# Patient Record
Sex: Female | Born: 1985 | Race: Asian | Hispanic: No | Marital: Married | Smoking: Never smoker
Health system: Southern US, Community
[De-identification: ages and names within clinical notes are randomized; demographics above are authoritative.]

## PROBLEM LIST (undated history)

## (undated) DIAGNOSIS — Z8489 Family history of other specified conditions: Secondary | ICD-10-CM

## (undated) DIAGNOSIS — E039 Hypothyroidism, unspecified: Secondary | ICD-10-CM

## (undated) DIAGNOSIS — D649 Anemia, unspecified: Secondary | ICD-10-CM

## (undated) DIAGNOSIS — T8859XA Other complications of anesthesia, initial encounter: Secondary | ICD-10-CM

---

## 2020-04-22 ENCOUNTER — Ambulatory Visit: Payer: Self-pay | Attending: Internal Medicine

## 2020-04-22 DIAGNOSIS — Z23 Encounter for immunization: Secondary | ICD-10-CM

## 2020-04-22 NOTE — Progress Notes (Signed)
   Covid-19 Vaccination Clinic  Name:  Margaret Roman    MRN: [id number] DOB: 02-Apr-1986  04/22/2020  Ms. Margaret Roman was observed post Covid-19 immunization for 15 minutes without incident. She was provided with Vaccine Information Sheet and instruction to access the V-Safe system.   Ms. Margaret Roman was instructed to call 911 with any severe reactions post vaccine: Marland Kitchen Difficulty breathing  . Swelling of face and throat  . A fast heartbeat  . A bad rash all over body  . Dizziness and weakness   Immunizations Administered    Name Date Dose VIS Date Route   Pfizer COVID-19 Vaccine 04/22/2020  4:06 PM 0.3 mL 02/11/2019 Intramuscular   Manufacturer: [employer], [employer]   Lot: [id number]   NDC: [address]-1

## 2020-05-18 ENCOUNTER — Ambulatory Visit: Payer: Self-pay | Attending: Internal Medicine

## 2020-05-18 DIAGNOSIS — Z23 Encounter for immunization: Secondary | ICD-10-CM

## 2020-05-18 NOTE — Progress Notes (Signed)
   Covid-19 Vaccination Clinic  Name:  Margaret Roman    MRN: [id number] DOB: [birth year]  05/18/2020  Ms. Margaret Roman was observed post Covid-19 immunization for 15 minutes without incident. She was provided with Vaccine Information Sheet and instruction to access the V-Safe system.   Ms. Margaret Roman was instructed to call 911 with any severe reactions post vaccine: Marland Kitchen Difficulty breathing  . Swelling of face and throat  . A fast heartbeat  . A bad rash all over body  . Dizziness and weakness   Immunizations Administered    Name Date Dose VIS Date Route   Pfizer COVID-19 Vaccine 05/18/2020  4:20 PM 0.3 mL 02/11/2019 Intramuscular   Manufacturer: [employer], [employer]   Lot: [id number]   NDC: [address]-3

## 2021-03-10 LAB — OB RESULTS CONSOLE ANTIBODY SCREEN: Antibody Screen: NEGATIVE

## 2021-03-10 LAB — OB RESULTS CONSOLE ABO/RH: RH Type: POSITIVE

## 2021-03-10 LAB — OB RESULTS CONSOLE RPR: RPR: NONREACTIVE

## 2021-03-10 LAB — OB RESULTS CONSOLE GC/CHLAMYDIA
Chlamydia: NEGATIVE
Gonorrhea: NEGATIVE

## 2021-03-10 LAB — OB RESULTS CONSOLE HIV ANTIBODY (ROUTINE TESTING): HIV: NONREACTIVE

## 2021-03-10 LAB — OB RESULTS CONSOLE RUBELLA ANTIBODY, IGM: Rubella: IMMUNE

## 2021-03-10 LAB — OB RESULTS CONSOLE HEPATITIS B SURFACE ANTIGEN: Hepatitis B Surface Ag: NEGATIVE

## 2021-05-10 DIAGNOSIS — O09522 Supervision of elderly multigravida, second trimester: Secondary | ICD-10-CM | POA: Diagnosis not present

## 2021-05-10 DIAGNOSIS — Z3A19 19 weeks gestation of pregnancy: Secondary | ICD-10-CM | POA: Diagnosis not present

## 2021-05-31 DIAGNOSIS — Z362 Encounter for other antenatal screening follow-up: Secondary | ICD-10-CM | POA: Diagnosis not present

## 2021-06-23 DIAGNOSIS — Z3A25 25 weeks gestation of pregnancy: Secondary | ICD-10-CM | POA: Diagnosis not present

## 2021-06-23 DIAGNOSIS — O99282 Endocrine, nutritional and metabolic diseases complicating pregnancy, second trimester: Secondary | ICD-10-CM | POA: Diagnosis not present

## 2021-07-15 DIAGNOSIS — Z3A28 28 weeks gestation of pregnancy: Secondary | ICD-10-CM | POA: Diagnosis not present

## 2021-07-15 DIAGNOSIS — O99283 Endocrine, nutritional and metabolic diseases complicating pregnancy, third trimester: Secondary | ICD-10-CM | POA: Diagnosis not present

## 2021-07-15 DIAGNOSIS — Z3689 Encounter for other specified antenatal screening: Secondary | ICD-10-CM | POA: Diagnosis not present

## 2021-07-15 DIAGNOSIS — Z23 Encounter for immunization: Secondary | ICD-10-CM | POA: Diagnosis not present

## 2021-08-11 DIAGNOSIS — Z3A32 32 weeks gestation of pregnancy: Secondary | ICD-10-CM | POA: Diagnosis not present

## 2021-08-11 DIAGNOSIS — O99283 Endocrine, nutritional and metabolic diseases complicating pregnancy, third trimester: Secondary | ICD-10-CM | POA: Diagnosis not present

## 2021-08-18 ENCOUNTER — Inpatient Hospital Stay (HOSPITAL_COMMUNITY)
Admission: AD | Admit: 2021-08-18 | Discharge: 2021-08-18 | Disposition: A | Discharge status: Home / Self Care | Payer: BC Managed Care – PPO | Attending: Obstetrics | Admitting: Obstetrics

## 2021-08-18 ENCOUNTER — Encounter (HOSPITAL_COMMUNITY): Payer: Self-pay

## 2021-08-18 ENCOUNTER — Other Ambulatory Visit: Payer: Self-pay

## 2021-08-18 DIAGNOSIS — Z79899 Other long term (current) drug therapy: Secondary | ICD-10-CM | POA: Diagnosis not present

## 2021-08-18 DIAGNOSIS — Z3A33 33 weeks gestation of pregnancy: Secondary | ICD-10-CM | POA: Diagnosis not present

## 2021-08-18 DIAGNOSIS — Y929 Unspecified place or not applicable: Secondary | ICD-10-CM | POA: Diagnosis not present

## 2021-08-18 DIAGNOSIS — Z3689 Encounter for other specified antenatal screening: Secondary | ICD-10-CM

## 2021-08-18 DIAGNOSIS — O09523 Supervision of elderly multigravida, third trimester: Secondary | ICD-10-CM | POA: Insufficient documentation

## 2021-08-18 DIAGNOSIS — Y939 Activity, unspecified: Secondary | ICD-10-CM | POA: Insufficient documentation

## 2021-08-18 DIAGNOSIS — O99891 Other specified diseases and conditions complicating pregnancy: Secondary | ICD-10-CM | POA: Diagnosis not present

## 2021-08-18 DIAGNOSIS — W19XXXA Unspecified fall, initial encounter: Secondary | ICD-10-CM

## 2021-08-18 DIAGNOSIS — W010XXA Fall on same level from slipping, tripping and stumbling without subsequent striking against object, initial encounter: Secondary | ICD-10-CM | POA: Insufficient documentation

## 2021-08-18 DIAGNOSIS — W101XXA Fall (on)(from) sidewalk curb, initial encounter: Secondary | ICD-10-CM

## 2021-08-18 HISTORY — DX: Hypothyroidism, unspecified: E03.9

## 2021-08-18 LAB — URINALYSIS, ROUTINE W REFLEX MICROSCOPIC
Bilirubin Urine: NEGATIVE
Glucose, UA: NEGATIVE mg/dL
Hgb urine dipstick: NEGATIVE
Ketones, ur: NEGATIVE mg/dL
Nitrite: NEGATIVE
Protein, ur: NEGATIVE mg/dL
Specific Gravity, Urine: 1.003 — ABNORMAL LOW (ref 1.005–1.030)
pH: 7 (ref 5.0–8.0)

## 2021-08-18 NOTE — MAU Note (Signed)
...  Margaret Roman is a 35 y.o. at [redacted]w[redacted]d here in MAU reporting: Patient states she was walking to the bust [place] her daughter up and fell at 1500 today. Patient states she did not hit her stomach directly but did land on her right side more towards her back "just a little bit." She states her right arm/wrist caught most of her fall/weight. Endorses rigorous fetal movement and states baby is always moving. No VB or LOF. Denies any pain and states "everything is fine and like normal like before."   Lab orders placed from triage: UA

## 2021-08-18 NOTE — MAU Provider Note (Signed)
History     CSN: [id number]  Arrival date and time: 08/18/21 1710   Event Date/Time   First Provider Initiated Contact with Patient 08/18/21 1807      Chief Complaint  Patient presents with   Fall   35 y.o. G2P1001 @33 .3 wks presenting after a fall. Reports tripping on a curb while at her child bus stop and fell to her right side. She landed on her leg, arm, and right side. She did not hit her abdomen. Reports good FM. No VB, LOF, ctx or pain. Denies head trauma or syncope.    OB History     Gravida  2   Para  1   Term  1   Preterm      AB      Living  1      SAB      IAB      Ectopic      Multiple      Live Births  1           Past Medical History:  Diagnosis Date   Hypothyroidism     Past Surgical History:  Procedure Laterality Date   CESAREAN SECTION      History reviewed. No pertinent family history.  Social History   Tobacco Use   Smoking status: Never   Smokeless tobacco: Never  Vaping Use   Vaping Use: Never used  Substance Use Topics   Alcohol use: Not Currently   Drug use: Never    Allergies:  Allergies  Allergen Reactions   Bee Pollen    Dust Mite Extract     Medications Prior to Admission  Medication Sig Dispense Refill Last Dose   ferrous sulfate 324 MG TBEC Take 324 mg by mouth.   08/17/2021   levothyroxine (SYNTHROID) 25 MCG tablet Take 25 mcg by mouth daily before breakfast.   08/18/2021   Prenatal Vit-Fe Fumarate-FA (PRENATAL MULTIVITAMIN) TABS tablet Take 1 tablet by mouth daily at 12 noon.   08/17/2021    Review of Systems  Gastrointestinal:  Negative for abdominal pain.  Genitourinary:  Negative for vaginal bleeding and vaginal discharge.  Physical Exam   Blood pressure 122/74, pulse (!) 103, temperature 98.6 F (37 C), temperature source Oral, resp. rate 18, height 5\' 1"  (1.549 m), weight 73 kg, SpO2 98 %.  Physical Exam Vitals and nursing note reviewed.  Constitutional:      General: She is not in acute  distress.    Appearance: Normal appearance.  HENT:     Head: Normocephalic and atraumatic.  Cardiovascular:     Rate and Rhythm: Normal rate.  Pulmonary:     Effort: Pulmonary effort is normal. No respiratory distress.  Abdominal:     Palpations: Abdomen is soft.     Tenderness: There is no abdominal tenderness.     Comments: gravid  Musculoskeletal:        General: Normal range of motion.     Cervical back: Normal range of motion.  Skin:    General: Skin is warm and dry.     Comments: Small skin scrape to left outer thigh  Neurological:     General: No focal deficit present.     Mental Status: She is alert and oriented to person, place, and time.  Psychiatric:        Mood and Affect: Mood normal.        Behavior: Behavior normal.  EFM: 135 bpm, mod variability, + accels, no decels Toco:  minimal UI  Results for orders placed or performed during the hospital encounter of 08/18/21 (from the past 24 hour(s))  Urinalysis, Routine w reflex microscopic Urine, Clean Catch     Status: Abnormal   Collection Time: 08/18/21  5:54 PM  Result Value Ref Range   Color, Urine STRAW (A) YELLOW   APPearance HAZY (A) CLEAR   Specific Gravity, Urine 1.003 (L) 1.005 - 1.030   pH 7.0 5.0 - 8.0   Glucose, UA NEGATIVE NEGATIVE mg/dL   Hgb urine dipstick NEGATIVE NEGATIVE   Bilirubin Urine NEGATIVE NEGATIVE   Ketones, ur NEGATIVE NEGATIVE mg/dL   Protein, ur NEGATIVE NEGATIVE mg/dL   Nitrite NEGATIVE NEGATIVE   Leukocytes,Ua MODERATE (A) NEGATIVE   RBC / HPF 0-5 0 - 5 RBC/hpf   WBC, UA 6-10 0 - 5 WBC/hpf   Bacteria, UA MANY (A) NONE SEEN   Squamous Epithelial / LPF 0-5 0 - 5   Mucus PRESENT    MAU Course  Procedures Prolonged EFM  MDM Feels well. No pain. Good FM. NST reactive. No signs of abruption. Denies VB, LOF, ctx. Stable for discharge home.   Assessment and Plan   1. [redacted] weeks gestation of pregnancy   2. NST (non-stress test) reactive   3. Fall, initial encounter     Discharge home Follow up at [facility] next week [facility] Abruption precautions Tylenol prn  Allergies as of 08/18/2021       Reactions   Bee Pollen    Dust Mite Extract         Medication List     TAKE these medications    ferrous sulfate 324 MG Tbec Take 324 mg by mouth.   levothyroxine 25 MCG tablet Commonly known as: SYNTHROID Take 25 mcg by mouth daily before breakfast.   prenatal multivitamin Tabs tablet Take 1 tablet by mouth daily at 12 noon.       Donette Larry, CNM 08/18/2021, 9:54 PM

## 2021-08-21 LAB — CULTURE, OB URINE: Culture: 50000 — AB

## 2021-08-25 ENCOUNTER — Telehealth: Payer: Self-pay | Admitting: Certified Nurse Midwife

## 2021-08-25 ENCOUNTER — Telehealth: Payer: Self-pay | Admitting: Student

## 2021-08-25 ENCOUNTER — Encounter: Payer: Self-pay | Admitting: Student

## 2021-08-25 DIAGNOSIS — Z2239 Carrier of other specified bacterial diseases: Secondary | ICD-10-CM | POA: Insufficient documentation

## 2021-08-25 DIAGNOSIS — Z3A34 34 weeks gestation of pregnancy: Secondary | ICD-10-CM | POA: Diagnosis not present

## 2021-08-25 DIAGNOSIS — O99283 Endocrine, nutritional and metabolic diseases complicating pregnancy, third trimester: Secondary | ICD-10-CM | POA: Diagnosis not present

## 2021-08-25 NOTE — Telephone Encounter (Signed)
Opened in error

## 2021-08-25 NOTE — Telephone Encounter (Signed)
TC with Dr. Ernestina Penna tp discuss Urine culture results and pharmacy recommendation for inpatient IV abx. Dr. Ernestina Penna will have patient come to [facility] for straight cath and urine culture at [facility].   Luna Kitchens

## 2021-09-02 ENCOUNTER — Other Ambulatory Visit: Payer: Self-pay | Admitting: Obstetrics & Gynecology

## 2021-09-09 DIAGNOSIS — Z3685 Encounter for antenatal screening for Streptococcus B: Secondary | ICD-10-CM | POA: Diagnosis not present

## 2021-09-09 LAB — OB RESULTS CONSOLE GBS: GBS: NEGATIVE

## 2021-09-13 ENCOUNTER — Encounter (HOSPITAL_COMMUNITY): Payer: Self-pay

## 2021-09-13 NOTE — Patient Instructions (Signed)
Margaret Roman  09/13/2021   Your procedure is scheduled on:  09/28/2021  Arrive at 1230 at Entrance C on [address] at [facility]  and [address]. You are invited to use the FREE valet parking or use the Visitor's parking deck.  Pick up the phone at the desk and dial [phone].  Call this number if you have problems the morning of surgery: [phone]  Remember:   Do not eat food:(After Midnight) Desps de medianoche.  Do not drink clear liquids: (After Midnight) Desps de medianoche.  Take these medicines the morning of surgery with A SIP OF WATER:  Take levothyroxine as prescribed   Do not wear jewelry, make-up or nail polish.  Do not wear lotions, powders, or perfumes. Do not wear deodorant.  Do not shave 48 hours prior to surgery.  Do not bring valuables to the hospital.  [facility] is not   responsible for any belongings or valuables brought to the hospital.  Contacts, dentures or bridgework may not be worn into surgery.  Leave suitcase in the car. After surgery it may be brought to your room.  For patients admitted to the hospital, checkout time is 11:00 AM the day of              discharge.      Please read over the following fact sheets that you were given:     Preparing for Surgery

## 2021-09-19 DIAGNOSIS — Z3A38 38 weeks gestation of pregnancy: Secondary | ICD-10-CM | POA: Diagnosis not present

## 2021-09-19 DIAGNOSIS — O26843 Uterine size-date discrepancy, third trimester: Secondary | ICD-10-CM | POA: Diagnosis not present

## 2021-09-26 ENCOUNTER — Other Ambulatory Visit: Payer: Self-pay | Admitting: Obstetrics & Gynecology

## 2021-09-26 ENCOUNTER — Other Ambulatory Visit: Payer: Self-pay

## 2021-09-26 ENCOUNTER — Encounter (HOSPITAL_COMMUNITY)
Admission: RE | Admit: 2021-09-26 | Discharge: 2021-09-26 | Disposition: A | Discharge status: Home / Self Care | Payer: BC Managed Care – PPO | Source: Ambulatory Visit | Attending: Obstetrics & Gynecology | Admitting: Obstetrics & Gynecology

## 2021-09-26 DIAGNOSIS — Z01812 Encounter for preprocedural laboratory examination: Secondary | ICD-10-CM | POA: Insufficient documentation

## 2021-09-26 DIAGNOSIS — O34219 Maternal care for unspecified type scar from previous cesarean delivery: Secondary | ICD-10-CM | POA: Diagnosis not present

## 2021-09-26 DIAGNOSIS — Z302 Encounter for sterilization: Secondary | ICD-10-CM | POA: Diagnosis not present

## 2021-09-26 DIAGNOSIS — D62 Acute posthemorrhagic anemia: Secondary | ICD-10-CM | POA: Diagnosis not present

## 2021-09-26 DIAGNOSIS — O34211 Maternal care for low transverse scar from previous cesarean delivery: Secondary | ICD-10-CM | POA: Diagnosis not present

## 2021-09-26 DIAGNOSIS — O9081 Anemia of the puerperium: Secondary | ICD-10-CM | POA: Diagnosis not present

## 2021-09-26 DIAGNOSIS — Z3A00 Weeks of gestation of pregnancy not specified: Secondary | ICD-10-CM | POA: Diagnosis not present

## 2021-09-26 DIAGNOSIS — Z3A39 39 weeks gestation of pregnancy: Secondary | ICD-10-CM | POA: Diagnosis not present

## 2021-09-26 DIAGNOSIS — E039 Hypothyroidism, unspecified: Secondary | ICD-10-CM | POA: Diagnosis not present

## 2021-09-26 DIAGNOSIS — Z23 Encounter for immunization: Secondary | ICD-10-CM | POA: Diagnosis not present

## 2021-09-26 DIAGNOSIS — O99284 Endocrine, nutritional and metabolic diseases complicating childbirth: Secondary | ICD-10-CM | POA: Diagnosis not present

## 2021-09-26 HISTORY — DX: Other complications of anesthesia, initial encounter: T88.59XA

## 2021-09-26 HISTORY — DX: Family history of other specified conditions: Z84.89

## 2021-09-26 HISTORY — DX: Anemia, unspecified: D64.9

## 2021-09-26 LAB — CBC
HCT: 34.5 % — ABNORMAL LOW (ref 36.0–46.0)
Hemoglobin: 11.8 g/dL — ABNORMAL LOW (ref 12.0–15.0)
MCH: 32.2 pg (ref 26.0–34.0)
MCHC: 34.2 g/dL (ref 30.0–36.0)
MCV: 94.3 fL (ref 80.0–100.0)
Platelets: 243 10*3/uL (ref 150–400)
RBC: 3.66 MIL/uL — ABNORMAL LOW (ref 3.87–5.11)
RDW: 13.6 % (ref 11.5–15.5)
WBC: 10.7 10*3/uL — ABNORMAL HIGH (ref 4.0–10.5)
nRBC: 0 % (ref 0.0–0.2)

## 2021-09-26 LAB — TYPE AND SCREEN
ABO/RH(D): O POS
Antibody Screen: NEGATIVE

## 2021-09-26 NOTE — Pre-Procedure Instructions (Signed)
PAT testing & information completed with pt/spouse.  They both verbalized understanding.  Both verbalized concern related to Succinylcholine allergy.  Spouse states that they have been told in their community/family that there was a sever reaction & that medication should be avoided. 

## 2021-09-26 NOTE — Pre-Procedure Instructions (Signed)
PAT testing & information completed with pt/spouse.  They both verbalized understanding.  Both verbalized concern related to Succinylcholine allergy.  Spouse states that they have been told in their community/family that there was a sever reaction & that medication should be avoided.

## 2021-09-27 LAB — RPR: RPR Ser Ql: NONREACTIVE

## 2021-09-27 LAB — SARS CORONAVIRUS 2 (TAT 6-24 HRS): SARS Coronavirus 2: NEGATIVE

## 2021-09-28 ENCOUNTER — Inpatient Hospital Stay (HOSPITAL_COMMUNITY): Payer: BC Managed Care – PPO

## 2021-09-28 ENCOUNTER — Inpatient Hospital Stay (HOSPITAL_COMMUNITY)
Admission: RE | Admit: 2021-09-28 | Discharge: 2021-09-30 | DRG: 784 | Disposition: A | Discharge status: Home / Self Care | Payer: BC Managed Care – PPO | Attending: Obstetrics & Gynecology | Admitting: Obstetrics & Gynecology

## 2021-09-28 ENCOUNTER — Encounter (HOSPITAL_COMMUNITY)
Admission: RE | Disposition: A | Discharge status: Home / Self Care | Payer: Self-pay | Source: Home / Self Care | Attending: Obstetrics & Gynecology

## 2021-09-28 ENCOUNTER — Encounter (HOSPITAL_COMMUNITY): Payer: Self-pay | Admitting: Obstetrics & Gynecology

## 2021-09-28 ENCOUNTER — Other Ambulatory Visit: Payer: Self-pay

## 2021-09-28 DIAGNOSIS — Z302 Encounter for sterilization: Secondary | ICD-10-CM

## 2021-09-28 DIAGNOSIS — O9081 Anemia of the puerperium: Secondary | ICD-10-CM | POA: Diagnosis not present

## 2021-09-28 DIAGNOSIS — E039 Hypothyroidism, unspecified: Secondary | ICD-10-CM | POA: Diagnosis present

## 2021-09-28 DIAGNOSIS — O34211 Maternal care for low transverse scar from previous cesarean delivery: Principal | ICD-10-CM | POA: Diagnosis present

## 2021-09-28 DIAGNOSIS — Z9079 Acquired absence of other genital organ(s): Secondary | ICD-10-CM

## 2021-09-28 DIAGNOSIS — Z23 Encounter for immunization: Secondary | ICD-10-CM | POA: Diagnosis not present

## 2021-09-28 DIAGNOSIS — O9902 Anemia complicating childbirth: Secondary | ICD-10-CM | POA: Diagnosis present

## 2021-09-28 DIAGNOSIS — O99284 Endocrine, nutritional and metabolic diseases complicating childbirth: Secondary | ICD-10-CM | POA: Diagnosis present

## 2021-09-28 DIAGNOSIS — D62 Acute posthemorrhagic anemia: Secondary | ICD-10-CM | POA: Diagnosis not present

## 2021-09-28 DIAGNOSIS — Z3A39 39 weeks gestation of pregnancy: Secondary | ICD-10-CM | POA: Diagnosis not present

## 2021-09-28 DIAGNOSIS — Z98891 History of uterine scar from previous surgery: Secondary | ICD-10-CM

## 2021-09-28 SURGERY — Surgical Case
Anesthesia: Spinal | Laterality: Bilateral | Wound class: Clean Contaminated

## 2021-09-28 MED ORDER — CEFAZOLIN SODIUM-DEXTROSE 2-4 GM/100ML-% IV SOLN
2.0000 g | INTRAVENOUS | Status: AC
  Administered 2021-09-28: 2 g via INTRAVENOUS

## 2021-09-28 MED ORDER — MORPHINE SULFATE (PF) 0.5 MG/ML IJ SOLN
INTRAMUSCULAR | Status: AC
  Filled 2021-09-28: qty 10

## 2021-09-28 MED ORDER — COCONUT OIL OIL: 1.0000 "application " | TOPICAL_OIL | Status: DC | PRN

## 2021-09-28 MED ORDER — SCOPOLAMINE 1 MG/3DAYS TD PT72
1.0000 | MEDICATED_PATCH | Freq: Once | TRANSDERMAL | Status: DC
  Administered 2021-09-28: 1.5 mg via TRANSDERMAL
  Filled 2021-09-28: qty 1

## 2021-09-28 MED ORDER — PHENYLEPHRINE HCL-NACL 20-0.9 MG/250ML-% IV SOLN
INTRAVENOUS | Status: AC
  Filled 2021-09-28: qty 250

## 2021-09-28 MED ORDER — ZOLPIDEM TARTRATE 5 MG PO TABS: 5.0000 mg | ORAL_TABLET | Freq: Every evening | ORAL | Status: DC | PRN

## 2021-09-28 MED ORDER — DIPHENHYDRAMINE HCL 50 MG/ML IJ SOLN: 12.5000 mg | INTRAMUSCULAR | Status: DC | PRN

## 2021-09-28 MED ORDER — FENTANYL CITRATE (PF) 100 MCG/2ML IJ SOLN
INTRAMUSCULAR | Status: AC
  Filled 2021-09-28: qty 2

## 2021-09-28 MED ORDER — DIPHENHYDRAMINE HCL 25 MG PO CAPS: 25.0000 mg | ORAL_CAPSULE | ORAL | Status: DC | PRN

## 2021-09-28 MED ORDER — PRENATAL MULTIVITAMIN CH
1.0000 | ORAL_TABLET | Freq: Every day | ORAL | Status: DC
  Administered 2021-09-29 – 2021-09-30 (×2): 1 via ORAL
  Filled 2021-09-28 (×2): qty 1

## 2021-09-28 MED ORDER — SIMETHICONE 80 MG PO CHEW: 80.0000 mg | CHEWABLE_TABLET | ORAL | Status: DC | PRN

## 2021-09-28 MED ORDER — LORATADINE 10 MG PO TABS: 10.0000 mg | ORAL_TABLET | Freq: Every day | ORAL | Status: DC | PRN

## 2021-09-28 MED ORDER — SODIUM CHLORIDE 0.9% FLUSH: 3.0000 mL | INTRAVENOUS | Status: DC | PRN

## 2021-09-28 MED ORDER — KETOROLAC TROMETHAMINE 30 MG/ML IJ SOLN: 30.0000 mg | Freq: Four times a day (QID) | INTRAMUSCULAR | Status: DC | PRN

## 2021-09-28 MED ORDER — FENTANYL CITRATE (PF) 100 MCG/2ML IJ SOLN
INTRAMUSCULAR | Status: DC | PRN
  Administered 2021-09-28: 15 ug via INTRATHECAL

## 2021-09-28 MED ORDER — ACETAMINOPHEN 325 MG PO TABS: 650.0000 mg | ORAL_TABLET | ORAL | Status: DC | PRN

## 2021-09-28 MED ORDER — NALBUPHINE HCL 10 MG/ML IJ SOLN: 5.0000 mg | INTRAMUSCULAR | Status: DC | PRN

## 2021-09-28 MED ORDER — KETOROLAC TROMETHAMINE 30 MG/ML IJ SOLN
30.0000 mg | Freq: Four times a day (QID) | INTRAMUSCULAR | Status: DC | PRN
  Administered 2021-09-28: 30 mg via INTRAVENOUS

## 2021-09-28 MED ORDER — ACETAMINOPHEN 10 MG/ML IV SOLN
INTRAVENOUS | Status: DC | PRN
  Administered 2021-09-28: 1000 mg via INTRAVENOUS

## 2021-09-28 MED ORDER — MENTHOL 3 MG MT LOZG: 1.0000 | LOZENGE | OROMUCOSAL | Status: DC | PRN

## 2021-09-28 MED ORDER — NALOXONE HCL 4 MG/10ML IJ SOLN
1.0000 ug/kg/h | INTRAVENOUS | Status: DC | PRN
  Filled 2021-09-28: qty 5

## 2021-09-28 MED ORDER — KETOROLAC TROMETHAMINE 30 MG/ML IJ SOLN
30.0000 mg | Freq: Four times a day (QID) | INTRAMUSCULAR | Status: AC
  Administered 2021-09-28 – 2021-09-29 (×3): 30 mg via INTRAVENOUS
  Filled 2021-09-28 (×4): qty 1

## 2021-09-28 MED ORDER — POVIDONE-IODINE 10 % EX SWAB: 2.0000 "application " | Freq: Once | CUTANEOUS | Status: DC

## 2021-09-28 MED ORDER — OXYTOCIN-SODIUM CHLORIDE 30-0.9 UT/500ML-% IV SOLN: 2.5000 [IU]/h | INTRAVENOUS | Status: AC

## 2021-09-28 MED ORDER — ACETAMINOPHEN 10 MG/ML IV SOLN
INTRAVENOUS | Status: AC
  Filled 2021-09-28: qty 100

## 2021-09-28 MED ORDER — SIMETHICONE 80 MG PO CHEW
80.0000 mg | CHEWABLE_TABLET | Freq: Three times a day (TID) | ORAL | Status: DC
  Administered 2021-09-28 – 2021-09-30 (×3): 80 mg via ORAL
  Filled 2021-09-28 (×4): qty 1

## 2021-09-28 MED ORDER — IBUPROFEN 600 MG PO TABS
600.0000 mg | ORAL_TABLET | Freq: Four times a day (QID) | ORAL | Status: DC
  Administered 2021-09-30 (×3): 600 mg via ORAL
  Filled 2021-09-28 (×3): qty 1

## 2021-09-28 MED ORDER — NALBUPHINE HCL 10 MG/ML IJ SOLN: 5.0000 mg | Freq: Once | INTRAMUSCULAR | Status: DC | PRN

## 2021-09-28 MED ORDER — PHENYLEPHRINE HCL (PRESSORS) 10 MG/ML IV SOLN
INTRAVENOUS | Status: DC | PRN
  Administered 2021-09-28 (×3): 40 ug via INTRAVENOUS

## 2021-09-28 MED ORDER — WITCH HAZEL-GLYCERIN EX PADS: 1.0000 "application " | MEDICATED_PAD | CUTANEOUS | Status: DC | PRN

## 2021-09-28 MED ORDER — ONDANSETRON HCL 4 MG/2ML IJ SOLN: 4.0000 mg | Freq: Three times a day (TID) | INTRAMUSCULAR | Status: DC | PRN

## 2021-09-28 MED ORDER — ONDANSETRON HCL 4 MG/2ML IJ SOLN
INTRAMUSCULAR | Status: DC | PRN
  Administered 2021-09-28: 4 mg via INTRAVENOUS

## 2021-09-28 MED ORDER — OXYCODONE HCL 5 MG PO TABS: 5.0000 mg | ORAL_TABLET | ORAL | Status: DC | PRN

## 2021-09-28 MED ORDER — KETOROLAC TROMETHAMINE 30 MG/ML IJ SOLN
INTRAMUSCULAR | Status: AC
  Filled 2021-09-28: qty 1

## 2021-09-28 MED ORDER — DIPHENHYDRAMINE HCL 25 MG PO CAPS: 25.0000 mg | ORAL_CAPSULE | Freq: Four times a day (QID) | ORAL | Status: DC | PRN

## 2021-09-28 MED ORDER — ACETAMINOPHEN 500 MG PO TABS
1000.0000 mg | ORAL_TABLET | Freq: Four times a day (QID) | ORAL | Status: AC
  Administered 2021-09-28 – 2021-09-29 (×4): 1000 mg via ORAL
  Filled 2021-09-28 (×4): qty 2

## 2021-09-28 MED ORDER — BUPIVACAINE IN DEXTROSE 0.75-8.25 % IT SOLN
INTRATHECAL | Status: DC | PRN
  Administered 2021-09-28: 1.6 mL via INTRATHECAL

## 2021-09-28 MED ORDER — ONDANSETRON HCL 4 MG/2ML IJ SOLN
INTRAMUSCULAR | Status: AC
  Filled 2021-09-28: qty 2

## 2021-09-28 MED ORDER — MORPHINE SULFATE (PF) 0.5 MG/ML IJ SOLN
INTRAMUSCULAR | Status: DC | PRN
  Administered 2021-09-28: 150 ug via INTRATHECAL

## 2021-09-28 MED ORDER — LACTATED RINGERS IV SOLN: INTRAVENOUS | Status: DC

## 2021-09-28 MED ORDER — DEXAMETHASONE SODIUM PHOSPHATE 4 MG/ML IJ SOLN
INTRAMUSCULAR | Status: DC | PRN
Start: 2021-09-28 — End: 2021-09-28
  Administered 2021-09-28: 10 mg via INTRAVENOUS

## 2021-09-28 MED ORDER — TETANUS-DIPHTH-ACELL PERTUSSIS 5-2.5-18.5 LF-MCG/0.5 IM SUSY: 0.5000 mL | PREFILLED_SYRINGE | Freq: Once | INTRAMUSCULAR | Status: DC

## 2021-09-28 MED ORDER — DIBUCAINE (PERIANAL) 1 % EX OINT: 1.0000 "application " | TOPICAL_OINTMENT | CUTANEOUS | Status: DC | PRN

## 2021-09-28 MED ORDER — PHENYLEPHRINE HCL-NACL 20-0.9 MG/250ML-% IV SOLN
INTRAVENOUS | Status: DC | PRN
  Administered 2021-09-28: 60 ug/min via INTRAVENOUS

## 2021-09-28 MED ORDER — NALBUPHINE HCL 10 MG/ML IJ SOLN
5.0000 mg | Freq: Once | INTRAMUSCULAR | Status: DC | PRN
Start: 2021-09-28 — End: 2021-09-30

## 2021-09-28 MED ORDER — OXYTOCIN-SODIUM CHLORIDE 30-0.9 UT/500ML-% IV SOLN
INTRAVENOUS | Status: DC | PRN
  Administered 2021-09-28: 30 [IU] via INTRAVENOUS

## 2021-09-28 MED ORDER — NALOXONE HCL 0.4 MG/ML IJ SOLN: 0.4000 mg | INTRAMUSCULAR | Status: DC | PRN

## 2021-09-28 MED ORDER — DEXAMETHASONE SODIUM PHOSPHATE 10 MG/ML IJ SOLN
INTRAMUSCULAR | Status: AC
  Filled 2021-09-28: qty 1

## 2021-09-28 MED ORDER — LEVOTHYROXINE SODIUM 50 MCG PO TABS
25.0000 ug | ORAL_TABLET | ORAL | Status: DC
  Administered 2021-09-29 – 2021-09-30 (×2): 25 ug via ORAL
  Filled 2021-09-28 (×2): qty 1

## 2021-09-28 MED ORDER — OXYTOCIN-SODIUM CHLORIDE 30-0.9 UT/500ML-% IV SOLN
INTRAVENOUS | Status: AC
  Filled 2021-09-28: qty 500

## 2021-09-28 MED ORDER — SENNOSIDES-DOCUSATE SODIUM 8.6-50 MG PO TABS
2.0000 | ORAL_TABLET | Freq: Every day | ORAL | Status: DC
  Administered 2021-09-29 – 2021-09-30 (×2): 2 via ORAL
  Filled 2021-09-28 (×3): qty 2

## 2021-09-28 SURGICAL SUPPLY — 33 items
BENZOIN TINCTURE PRP APPL 2/3 (GAUZE/BANDAGES/DRESSINGS) ×2 IMPLANT
CHLORAPREP W/TINT 26ML (MISCELLANEOUS) ×2 IMPLANT
CLAMP CORD UMBIL (MISCELLANEOUS) IMPLANT
CLOTH BEACON ORANGE TIMEOUT ST (SAFETY) ×2 IMPLANT
DRSG OPSITE POSTOP 4X10 (GAUZE/BANDAGES/DRESSINGS) ×2 IMPLANT
ELECT REM PT RETURN 9FT ADLT (ELECTROSURGICAL) ×2
ELECTRODE REM PT RTRN 9FT ADLT (ELECTROSURGICAL) ×1 IMPLANT
EXTRACTOR VACUUM KIWI (MISCELLANEOUS) IMPLANT
EXTRACTOR VACUUM M CUP 4 TUBE (SUCTIONS) IMPLANT
GLOVE BIO SURGEON STRL SZ7 (GLOVE) ×2 IMPLANT
GLOVE BIOGEL PI IND STRL 7.0 (GLOVE) ×3 IMPLANT
GLOVE BIOGEL PI INDICATOR 7.0 (GLOVE) ×3
GOWN STRL REUS W/TWL LRG LVL3 (GOWN DISPOSABLE) ×6 IMPLANT
KIT ABG SYR 3ML LUER SLIP (SYRINGE) IMPLANT
NEEDLE HYPO 25X5/8 SAFETYGLIDE (NEEDLE) IMPLANT
NS IRRIG 1000ML POUR BTL (IV SOLUTION) ×2 IMPLANT
PACK C SECTION WH (CUSTOM PROCEDURE TRAY) ×2 IMPLANT
PAD OB MATERNITY 4.3X12.25 (PERSONAL CARE ITEMS) ×2 IMPLANT
RTRCTR C-SECT PINK 25CM LRG (MISCELLANEOUS) IMPLANT
STRIP CLOSURE SKIN 1/2X4 (GAUZE/BANDAGES/DRESSINGS) ×2 IMPLANT
SUT MNCRL 0 VIOLET CTX 36 (SUTURE) ×2 IMPLANT
SUT MONOCRYL 0 CTX 36 (SUTURE) ×2
SUT PLAIN 0 NONE (SUTURE) IMPLANT
SUT PLAIN 2 0 (SUTURE)
SUT PLAIN ABS 2-0 CT1 27XMFL (SUTURE) IMPLANT
SUT VIC AB 0 CT1 27 (SUTURE) ×2
SUT VIC AB 0 CT1 27XBRD ANBCTR (SUTURE) ×2 IMPLANT
SUT VIC AB 2-0 CT1 27 (SUTURE) ×1
SUT VIC AB 2-0 CT1 TAPERPNT 27 (SUTURE) ×1 IMPLANT
SUT VIC AB 4-0 KS 27 (SUTURE) ×2 IMPLANT
TOWEL OR 17X24 6PK STRL BLUE (TOWEL DISPOSABLE) ×2 IMPLANT
TRAY FOLEY W/BAG SLVR 14FR LF (SET/KITS/TRAYS/PACK) IMPLANT
WATER STERILE IRR 1000ML POUR (IV SOLUTION) ×2 IMPLANT

## 2021-09-28 NOTE — Transfer of Care (Signed)
Immediate Anesthesia Transfer of Care Note  Patient: Margaret Roman  Procedure(s) Performed: Repeat CESAREAN SECTION WITH BILATERAL TUBAL LIGATION (Bilateral)  Patient Location: PACU  Anesthesia Type:Spinal  Level of Consciousness: awake, alert  and oriented  Airway & Oxygen Therapy: Patient Spontanous Breathing  Post-op Assessment: Report given to RN and Post -op Vital signs reviewed and stable  Post vital signs: Reviewed and stable  Last Vitals:  Vitals Value Taken Time  BP 107/74 09/28/21 1638  Temp    Pulse 93 09/28/21 1644  Resp 18 09/28/21 1644  SpO2 96 % 09/28/21 1644  Vitals shown include unvalidated device data.  Last Pain:  Vitals:   09/28/21 1235  TempSrc: Oral         Complications: No notable events documented.

## 2021-09-28 NOTE — Lactation Note (Signed)
This note was copied from a baby's chart. Lactation Consultation Note  Patient Name: Margaret Roman [id number] Date: 09/28/2021 Reason for consult: Initial assessment;Maternal endocrine disorder;Term Age:37 hours  Initial visit to 4 hours old infant of a P2 mother. Mother is  experienced and breastfed first child, now 64 y.o, for >12 months. Per parents, baby had some colostrum via spoon with RN assistance.  LC reviewed hand expression technique. Noted small, short shafted nipples but pliable breast tissue.  Reviewed normal newborn behavior during first 24h, expected output, tummy size and feeding frequency. Talked about consistent with current colostrum amount.  FOB is present and supportive.   Plan: 1-Skin to skin, aim for a deep, comfortable latch and breastfeed on demand or 8-12 times in 24h period. 2-Encouraged maternal rest, hydration and food intake.  3-Contact LC as needed for feeds/support/concerns/questions   All questions answered at this time. Provided Lactation services brochure and promoted [employer] booklet information.     Maternal Data Has patient been taught Hand Expression?: Yes Does the patient have breastfeeding experience prior to this delivery?: Yes How long did the patient breastfeed?: >12 months  Feeding Mother's Current Feeding Choice: Breast Milk  LATCH Score Latch: Too sleepy or reluctant, no latch achieved, no sucking elicited.  Audible Swallowing: None  Type of Nipple: Everted at rest and after stimulation  Comfort (Breast/Nipple): Soft / non-tender  Hold (Positioning): Full assist, staff holds infant at breast  LATCH Score: 4  Interventions Interventions: Breast feeding basics reviewed;Skin to skin;Hand express;Breast massage;Expressed milk;Education;LC Services brochure  Discharge Pump: Personal WIC Program: No  Consult Status Consult Status: Follow-up Date: 09/29/21 Follow-up type: In-patient    Zakhari Fogel A Higuera  Ancidey 09/28/2021, 8:14 PM

## 2021-09-28 NOTE — Anesthesia Preprocedure Evaluation (Addendum)
Anesthesia Evaluation  Patient identified by MRN, date of birth, ID band Patient awake    Reviewed: Allergy & Precautions, NPO status , Patient's Chart, lab work & pertinent test results  Airway Mallampati: II  TM Distance: >3 FB Neck ROM: Full    Dental no notable dental hx. (+) Teeth Intact, Dental Advisory Given   Pulmonary neg pulmonary ROS,    Pulmonary exam normal breath sounds clear to auscultation       Cardiovascular negative cardio ROS Normal cardiovascular exam Rhythm:Regular Rate:Normal     Neuro/Psych negative neurological ROS  negative psych ROS   GI/Hepatic negative GI ROS, Neg liver ROS,   Endo/Other  Hypothyroidism   Renal/GU negative Renal ROS  negative genitourinary   Musculoskeletal negative musculoskeletal ROS (+)   Abdominal   Peds  Hematology negative hematology ROS (+)   Anesthesia Other Findings Repeat C/S  Increased incidence of pseudocholinesterase deficiency in her [place] community. No known family members with this diagnosis.   Reproductive/Obstetrics (+) Pregnancy                            Anesthesia Physical Anesthesia Plan  ASA: 2  Anesthesia Plan: Spinal   Post-op Pain Management:    Induction:   PONV Risk Score and Plan: Treatment may vary due to age or medical condition  Airway Management Planned: Natural Airway  Additional Equipment:   Intra-op Plan:   Post-operative Plan:   Informed Consent: I have reviewed the patients History and Physical, chart, labs and discussed the procedure including the risks, benefits and alternatives for the proposed anesthesia with the patient or authorized representative who has indicated his/her understanding and acceptance.     Dental advisory given  Plan Discussed with: CRNA  Anesthesia Plan Comments:         Anesthesia Quick Evaluation

## 2021-09-28 NOTE — H&P (Signed)
Margaret Roman is a 35 y.o. female G2P1001, presenting for elective repeat C/section, 39.2 weeks  Good PNCare. Hypothyroid, well controlled, labs ea trimester. Serial growth nl in 3rd trim, NST wkly from 36 wks reactive  AMA - NIPT low risk, other labs incl Glucola nl  Anemia- on iron   Prior C/section for arrest of dilatation.  Pt has succinylcholine metabolism problem- genetic, avoid general anesthesia  OB History     Gravida  2   Para  1   Term  1   Preterm      AB      Living  1      SAB      IAB      Ectopic      Multiple      Live Births  1          Past Medical History:  Diagnosis Date   Anemia    Complication of anesthesia    Family history of adverse reaction to anesthesia    family allergy to succinocholine.  unsure of reaction but told not to take it   Hypothyroidism    Past Surgical History:  Procedure Laterality Date   CESAREAN SECTION     Family History: family history is not on file. Social History:  reports that she has never smoked. She has never used smokeless tobacco. She reports that she does not currently use alcohol. She reports that she does not use drugs.     Maternal Diabetes: No Genetic Screening: Normal Maternal Ultrasounds/Referrals: Normal Fetal Ultrasounds or other Referrals:  None Maternal Substance Abuse:  No Significant Maternal Medications:  Meds include: Syntroid Significant Maternal Lab Results:  Group B Strep negative Other Comments:  None  Review of Systems History   There were no vitals taken for this visit. Exam Physical Exam  BP 120/77   Pulse (!) 104   Temp 98 F (36.7 C) (Oral)   Resp 16   Ht 5\' 1"  (1.549 m)   Wt 77.1 kg   SpO2 99%   BMI 32.12 kg/m    A&O x 3, no acute distress. Pleasant HEENT neg, no thyromegaly Lungs CTA bilat CV RRR, S1S2 normal Abdo soft, non tender, non acute Extr no edema/ tenderness Pelvic cx closed in office  FHT  130s Toco none  Prenatal labs: ABO,  Rh: --/--/O POS (10/10 0955) Antibody: NEG (10/10 0955) Rubella: Immune (03/24 0000) RPR: NON REACTIVE (10/10 0955)  HBsAg: Negative (03/24 0000)  HIV: Non-reactive (03/24 0000)  GBS: Negative/-- (09/23 0000)  AFP1 nl  NIPT low risk  Glucola nl   Assessment/Plan: 35 yo G2P1001, 39.2 wks here for repeat C/s and TL. Reviewed permanent sterilization/ possible salpingectomy and agrees Risks/complications of surgery reviewed incl infection, bleeding, damage to internal organs including bladder, bowels, ureters, blood vessels, other risks from anesthesia, VTE and delayed complications of any surgery, complications in future surgery reviewed. Also discussed neonatal complications incl difficult delivery, laceration, vacuum assistance, TTN etc. Pt understands and agrees, all concerns addressed.     30 09/28/2021, 7:06 AM

## 2021-09-28 NOTE — Anesthesia Postprocedure Evaluation (Signed)
Anesthesia Post Note  Patient: Margaret Roman  Procedure(s) Performed: Repeat CESAREAN SECTION WITH BILATERAL TUBAL LIGATION (Bilateral)     Patient location during evaluation: PACU Anesthesia Type: Spinal Level of consciousness: oriented and awake and alert Pain management: pain level controlled Vital Signs Assessment: post-procedure vital signs reviewed and stable Respiratory status: spontaneous breathing, respiratory function stable and patient connected to nasal cannula oxygen Cardiovascular status: blood pressure returned to baseline and stable Postop Assessment: no headache, no backache and no apparent nausea or vomiting Anesthetic complications: no   No notable events documented.  Last Vitals:  Vitals:   09/28/21 1818 09/28/21 1936  BP: 94/61 93/65  Pulse: 81 81  Resp: 14 16  Temp: 36.7 C (!) 36.4 C  SpO2: 99% 99%    Last Pain:  Vitals:   09/28/21 1936  TempSrc: Oral  PainSc:                  Shin Lamour L Isamu Trammel

## 2021-09-28 NOTE — Op Note (Addendum)
Cesarean Section Procedure Note   Margaret Roman  09/28/2021  Procedure: Repeat Low Transverse C-section and bilateral tubal sterilization by salpingectomy   Indications: Scheduled Proceedure/Maternal Request and tubal sterilization request     Pre-operative Diagnosis: Previous Cesarean Section, Desires Sterilization. 39.3 weeks  Post-operative Diagnosis: Same   Surgeon: Shea Evans, MD - Primary   Assistants: Carlean Jews, CNM   Anesthesia: epidural   Procedure Details:  The patient was seen in the Holding Room. The risks, benefits, complications, treatment options, and expected outcomes were discussed with the patient. The patient concurred with the proposed plan, giving informed consent. identified as Margaret Roman and the procedure verified as C-Section Delivery and tubal sterilization. In OR, A Time Out was held and the above information confirmed. 2 gm Ancef given.  After induction of spinal anesthesia, the patient was draped and prepped in the usual sterile manner, foley was draining urine well.  A pfannenstiel incision was made and carried down through the subcutaneous tissue to the fascia. Fascial incision was made and extended transversely. The fascia was separated from the underlying rectus tissue superiorly and inferiorly. The peritoneum was identified and entered. Peritoneal incision was extended longitudinally. Alexis-O retractor placed. Bladder peritoneum was not dissected. Instead a direct low transverse uterine incision was made aware from bladder reflection.  Delivered from cephalic presentation was a female infant with vigorous cry. Apgar scores of 9 at one minute and 9 at five minutes. Delayed cord clamping done at 1 minute and baby handed to NICU team in attendance. Cord ph was not sent. Cord blood was obtained for evaluation. The placenta was removed Intact and appeared normal. The uterine outline, tubes and ovaries appeared normal}. The uterine  incision was closed with running locked sutures of . A second imbricating layer sutured.   Hemostasis was observed.  Bilateral tubes assessed, were normal. Babcock used to elevated the tube, a window created in mesosalpinx and two free of 0-plain gut ties passed and tied on proximal side and around the fimbria on the distal end and tube excised. Similar steps on opposite tube and tubes excised. Hemostasis noted.  Alexis retractor removed. Peritoneal closure done with 2-0 Vicryl.  The fascia was then reapproximated with running sutures of 0Vicryl. The subcuticular closure was performed using 2-0plain gut. The skin was closed with 4-0Vicryl. Steristrips, honeycomb and pressure dressing placed.  Instrument, sponge, and needle counts were correct prior the abdominal closure and were correct at the conclusion of the case.   Findings:Female infant delivered cephalic from [facility] hysterotomy. Low placenta noted right at the superior edge of hysterotomy. Normal ovaries and tubes. Apgars 9 and 9   Estimated Blood Loss: 190 per recording and fundal massage = 250 cc   Total IV Fluids: LR 2500 ml   Urine Output:  400CC OF clear urine  Specimens: cord blood   Complications: no complications  Disposition: PACU - hemodynamically stable.   Maternal Condition: stable   Baby condition / location:  Couplet care / Skin to Skin  Attending Attestation: I performed the procedure.   Signed: Surgeon(s): Shea Evans, MD

## 2021-09-28 NOTE — Anesthesia Procedure Notes (Signed)
Spinal  Patient location during procedure: OR Start time: 09/28/2021 3:15 PM End time: 09/28/2021 3:20 PM Reason for block: surgical anesthesia Staffing Performed: anesthesiologist  Anesthesiologist: Elmer Picker, MD Preanesthetic Checklist Completed: patient identified, IV checked, risks and benefits discussed, surgical consent, monitors and equipment checked, pre-op evaluation and timeout performed Spinal Block Patient position: sitting Prep: DuraPrep and site prepped and draped Patient monitoring: cardiac monitor, continuous pulse ox and blood pressure Approach: midline Location: L3-4 Injection technique: single-shot Needle Needle type: Pencan  Needle gauge: 24 G Needle length: 9 cm Assessment Sensory level: T6 Events: CSF return Additional Notes Functioning IV was confirmed and monitors were applied. Sterile prep and drape, including hand hygiene and sterile gloves were used. The patient was positioned and the spine was prepped. The skin was anesthetized with lidocaine.  Free flow of clear CSF was obtained prior to injecting local anesthetic into the CSF.  The spinal needle aspirated freely following injection.  The needle was carefully withdrawn.  The patient tolerated the procedure well.

## 2021-09-29 ENCOUNTER — Encounter (HOSPITAL_COMMUNITY): Payer: Self-pay | Admitting: Obstetrics & Gynecology

## 2021-09-29 DIAGNOSIS — O9902 Anemia complicating childbirth: Secondary | ICD-10-CM | POA: Diagnosis present

## 2021-09-29 LAB — CBC
HCT: 27.8 % — ABNORMAL LOW (ref 36.0–46.0)
Hemoglobin: 9.4 g/dL — ABNORMAL LOW (ref 12.0–15.0)
MCH: 31.9 pg (ref 26.0–34.0)
MCHC: 33.8 g/dL (ref 30.0–36.0)
MCV: 94.2 fL (ref 80.0–100.0)
Platelets: 258 10*3/uL (ref 150–400)
RBC: 2.95 MIL/uL — ABNORMAL LOW (ref 3.87–5.11)
RDW: 13.7 % (ref 11.5–15.5)
WBC: 16.1 10*3/uL — ABNORMAL HIGH (ref 4.0–10.5)
nRBC: 0 % (ref 0.0–0.2)

## 2021-09-29 MED ORDER — FERROUS SULFATE 325 (65 FE) MG PO TABS
325.0000 mg | ORAL_TABLET | Freq: Every day | ORAL | Status: DC
  Administered 2021-09-30: 325 mg via ORAL
  Filled 2021-09-29: qty 1

## 2021-09-29 MED ORDER — INFLUENZA VAC SPLIT QUAD 0.5 ML IM SUSY
0.5000 mL | PREFILLED_SYRINGE | Freq: Once | INTRAMUSCULAR | Status: AC
  Administered 2021-09-29: 0.5 mL via INTRAMUSCULAR
  Filled 2021-09-29: qty 0.5

## 2021-09-29 MED ORDER — MAGNESIUM OXIDE -MG SUPPLEMENT 400 (240 MG) MG PO TABS
400.0000 mg | ORAL_TABLET | Freq: Every day | ORAL | Status: DC
  Administered 2021-09-30: 400 mg via ORAL
  Filled 2021-09-29 (×2): qty 1

## 2021-09-29 NOTE — Lactation Note (Signed)
This note was copied from a baby's chart. Lactation Consultation Note  Patient Name: Margaret Roman [id number] Date: 09/29/2021 Reason for consult: Follow-up assessment Age:37 hours  LC in to room for follow up. Assisted with deep latch, mother has optimal alignment and support. Mother denies discomfort.    Plan: 1-Breastfeeding on demand or 8-12 times in 24h period. 2-Keep infant awake during breastfeeding session: massaging breast, infant's hand/shoulder/feet 3-Reinforce maternal rest, hydration and food intake.   Contact LC as needed for feeds/support/concerns/questions. All questions answered at this time.    Maternal Data Has patient been taught Hand Expression?: Yes  Feeding Mother's Current Feeding Choice: Breast Milk  LATCH Score Latch: Grasps breast easily, tongue down, lips flanged, rhythmical sucking.  Audible Swallowing: Spontaneous and intermittent  Type of Nipple: Everted at rest and after stimulation (short shafted)  Comfort (Breast/Nipple): Soft / non-tender  Hold (Positioning): Assistance needed to correctly position infant at breast and maintain latch.  LATCH Score: 9  Interventions Interventions: Breast feeding basics reviewed;Assisted with latch;Skin to skin;Breast massage;Hand express;Breast compression;Adjust position;Support pillows;Position options;Expressed milk;Education  Discharge Pump: Personal WIC Program: No  Consult Status Consult Status: Follow-up Date: 09/30/21 Follow-up type: In-patient    Margaret Roman 09/29/2021, 6:48 PM

## 2021-09-29 NOTE — Progress Notes (Signed)
   Subjective: POD# 1 Live born female  Birth Weight: 6 lb 11.4 oz (3045 g) APGAR: 9, 9  Newborn Delivery   Birth date/time: 09/28/2021 15:46:00 Delivery type: C-Section, Low Transverse Trial of labor: No C-section categorization: Repeat     Baby name: [name] Delivering provider: MODY, VAISHALI   Feeding: breast  Pain control at delivery: Spinal   Reports feeling well.  Patient reports tolerating PO.   Breast symptoms:latching well Pain controlled with  PO meds Denies HA/SOB/C/P/N/V/dizziness. Flatus absent. She reports vaginal bleeding as normal, without clots.  She is ambulating, no void yet, foley DC at 0500 today. Attempted to void once, unsuccessful.   Objective:   VS:    Vitals:   09/28/21 2100 09/28/21 2349 09/29/21 0130 09/29/21 0350  BP:    (!) 83/56  Pulse:    72  Resp: 18  17 18   Temp:  97.7 F (36.5 C)  97.7 F (36.5 C)  TempSrc:  Oral  Oral  SpO2: 99% 97% 99% 99%  Weight:      Height:         Intake/Output Summary (Last 24 hours) at 09/29/2021 0900 Last data filed at 09/29/2021 0535 Gross per 24 hour  Intake 1000 ml  Output 1311 ml  Net -311 ml        Recent Labs    09/26/21 0955 09/29/21 0443  WBC 10.7* 16.1*  HGB 11.8* 9.4*  HCT 34.5* 27.8*  PLT 243 258     Blood type: --/--/O POS (10/10 0955)  Rubella: Immune (03/24 0000)  Vaccines: TDaP          UTD         Flu             needed                    COVID-19 UTD   Physical Exam:  General: alert, cooperative, and no distress CV: Regular rate and rhythm Resp: clear Abdomen: soft, nontender, hypoactive bowel sounds, + tympany  Incision: serous drainage present and pressure dressing removed Uterine Fundus: firm, below umbilicus, nontender Lochia: minimal Ext: no edema, redness or tenderness in the calves or thighs  Assessment/Plan: 35 y.o.   POD# 1. 30                  Principal Problem:   Postpartum care following cesarean delivery 10/12 Active Problems:   S/P  cesarean section: elective repeat and s/p bilateral salpingectomy    Status post bilateral salpingectomy Doing well, stable.     - encourage void q 4 hrs, monitor closely for urinary retention  - dressing change today          Advance diet as tolerated Encourage rest when baby rests Breastfeeding support Encourage to ambulate, warm fluids for gut motility Flu vaccine today Routine post-op care    Hypothyroidism  - stable on levo 25 mcg   Maternal anemia, with delivery - IDA w/ ABL  - asymptomatic  - started oral Fe and Mag Ox   12/12, CNM, MSN 09/29/2021, 9:00 AM

## 2021-09-30 MED ORDER — SIMETHICONE 80 MG PO CHEW: 80.0000 mg | CHEWABLE_TABLET | Freq: Three times a day (TID) | ORAL | 0 refills | Status: AC

## 2021-09-30 MED ORDER — MEASLES, MUMPS & RUBELLA VAC IJ SOLR: 0.5000 mL | Freq: Once | INTRAMUSCULAR | Status: DC

## 2021-09-30 MED ORDER — SENNOSIDES-DOCUSATE SODIUM 8.6-50 MG PO TABS: 2.0000 | ORAL_TABLET | Freq: Every evening | ORAL | 0 refills | Status: AC | PRN

## 2021-09-30 MED ORDER — ACETAMINOPHEN 500 MG PO TABS
1000.0000 mg | ORAL_TABLET | Freq: Four times a day (QID) | ORAL | Status: DC
  Administered 2021-09-30: 1000 mg via ORAL
  Filled 2021-09-30: qty 2

## 2021-09-30 MED ORDER — IBUPROFEN 600 MG PO TABS: 600.0000 mg | ORAL_TABLET | Freq: Four times a day (QID) | ORAL | 0 refills | Status: AC

## 2021-09-30 MED ORDER — ACETAMINOPHEN 500 MG PO TABS: 1000.0000 mg | ORAL_TABLET | Freq: Four times a day (QID) | ORAL | 0 refills | Status: AC

## 2021-09-30 MED ORDER — COCONUT OIL OIL: 1.0000 "application " | TOPICAL_OIL | 0 refills | Status: AC | PRN

## 2021-09-30 NOTE — Discharge Summary (Signed)
Postpartum Discharge Summary  Date of Service updated 09/30/2021     Patient Name: Margaret Roman DOB: January 30, 1986 MRN: [id number]  Date of admission: 09/28/2021 Delivery date:09/28/2021  Delivering provider: MODY, VAISHALI  Date of discharge: 09/30/2021  Admitting diagnosis: Previous cesarean section [Z98.891] Status post repeat low transverse cesarean section [Z98.891] Intrauterine pregnancy: [redacted]w[redacted]d    Secondary diagnosis:  Principal Problem:   Postpartum care following cesarean delivery 10/12 Active Problems:   S/P cesarean section: elective repeat and s/p bilateral salpingectomy    Status post bilateral salpingectomy   Hypothyroidism   Maternal anemia, with delivery - IDA w/ ABL  Additional problems: Rubella non-immune    Discharge diagnosis: Term Pregnancy Delivered and Anemia                                              Post partum procedures: n/a Augmentation: N/A Complications: None  Hospital course: Sceduled C/S   35y.o. yo [id number] at [id number] admitted to the hospital 09/28/2021 for scheduled cesarean section with the following indication:Elective Repeat.Delivery details are as follows:  Membrane Rupture Time/Date: 3:45 PM ,09/28/2021   Delivery Method:C-Section, Low Transverse  Details of operation can be found in separate operative note.  Patient had an uncomplicated postpartum course.  She is ambulating, tolerating a regular diet, passing flatus, and urinating well. Patient is discharged home in stable condition on  09/30/21        Newborn Data: Birth date:09/28/2021  Birth time:3:46 PM  Gender:Female  Living status:Living  Apgars:9 ,9  Weight:3045 g    "Margaret Roman"   Magnesium Sulfate received: No BMZ received: No Rhophylac:N/A MMR: informed RN to offer to patient prior to d/c, no documentation noted T-DaP:Given prenatally Flu: given 09/29/21 Transfusion:No  Physical exam  Vitals:   09/29/21 0815 09/29/21 1951 09/30/21 0551 09/30/21 1435  BP:  (!) 87/52 (!) 93/55 105/67 122/79  Pulse: 65 (!) 57 67 78  Resp: _0 Temp: 98.1 F (36.7 C) 98.5 F (36.9 C) 98 F (36.7 C) 98.9 F (37.2 C)  TempSrc: Oral Oral Oral Oral  SpO2: 99% 100% 100% 100%  Weight:      Height:       General: alert, cooperative, and no distress Lungs: clear, equal bilaterally  Heart: RRR Abdomen: moderate gaseous distention, active bowel sounds in all quadrants Lochia: appropriate Uterine Fundus: firm, below umbilicus Incision: Healing well with no significant drainage, Dressing is clean, dry, and intact DVT Evaluation: No evidence of DVT seen on physical exam. Negative Homan's sign. No cords or calf tenderness. Calf/Ankle edema is present Labs: Lab Results  Component Value Date   WBC 16.1 (H) 09/29/2021   HGB 9.4 (L) 09/29/2021   HCT 27.8 (L) 09/29/2021   MCV 94.2 09/29/2021   PLT 258 09/29/2021   No flowsheet data found. Edinburgh Score: No flowsheet data found.    After visit meds:  Allergies as of 09/30/2021       Reactions   Bee Pollen    Dust Mite Extract    Succinylcholine    Per patient, increased incidence of pseudocholinesterase deficiency in her [employer]. No known family members with this diagnosis.         Medication List     TAKE these medications    acetaminophen 500 MG tablet Commonly known as: TYLENOL Take 2 tablets (  1,000 mg total) by mouth every 6 (six) hours.   coconut oil Oil Apply 1 application topically as needed.   ferrous sulfate 325 (65 FE) MG tablet Take 325 mg by mouth every evening.   ibuprofen 600 MG tablet Commonly known as: ADVIL Take 1 tablet (600 mg total) by mouth every 6 (six) hours.   levothyroxine 25 MCG tablet Commonly known as: SYNTHROID Take 25 mcg by mouth See admin instructions. Take 25 mcg daily on Mon, Tues, Thurs, Fri, and Sat   loratadine 10 MG tablet Commonly known as: CLARITIN Take 10 mg by mouth daily as needed for allergies.   PRENATAL DHA PO Take  1 tablet by mouth daily.   senna-docusate 8.6-50 MG tablet Commonly known as: Senokot-S Take 2 tablets by mouth at bedtime as needed for mild constipation.   simethicone 80 MG chewable tablet Commonly known as: MYLICON Chew 1 tablet (80 mg total) by mouth 3 (three) times daily after meals.         Discharge home in stable condition Infant Feeding: Breast Infant Disposition:home with mother Discharge instruction: per After Visit Summary and Postpartum booklet. Activity: Advance as tolerated. Pelvic rest for 6 weeks.  Diet: low salt diet Anticipated Birth Control:  s/p bilateral salpingectomy  Postpartum Appointment:6 weeks - patient requests 2 week check Additional Postpartum F/U: Postpartum Depression checkup Future Appointments:No future appointments. Follow up Visit:  Follow-up Information     Azucena Fallen, MD Follow up in 6 week(s).   Specialty: Obstetrics and Gynecology Why: 2 week Postpartum visit if needed per patient request, then 6 week PP visit Contact information: [place] [address] [phone]                     09/30/2021 Darliss Cheney, CNM

## 2021-09-30 NOTE — Lactation Note (Signed)
This note was copied from a baby's chart. Lactation Consultation Note  Patient Name: Margaret Roman [id number] Date: 09/30/2021 Reason for consult: Follow-up assessment Age:37 hours  Assist mother with pumping. Mother pumped 16 ml. Infant was given 16 ml ebm with a slow flow nipple. Infant took entire amt well without any spitting . Infant placed STS with mother.   Advised mother to continue to cue base feed infant . Mother to follow plan to offer infant ebm after breastfeeding.  Mother has a Medela pump at home . Mother to pump with DEBP for 15 mins.  Mother to continue to supplement infant after feedings.  Encouraged mother to continue to work on good deep latch.  Mother to follow up with Peds on Monday  Mother to follow up with OP LC . Informed Bethann Humble NP of consult.   Maternal Data    Feeding Mother's Current Feeding Choice: Breast Milk  LATCH Score Latch: Repeated attempts needed to sustain latch, nipple held in mouth throughout feeding, stimulation needed to elicit sucking reflex.  Audible Swallowing: Spontaneous and intermittent  Type of Nipple: Everted at rest and after stimulation  Comfort (Breast/Nipple): Filling, red/small blisters or bruises, mild/mod discomfort  Hold (Positioning): Assistance needed to correctly position infant at breast and maintain latch.  LATCH Score: 7   Lactation Tools Discussed/Used    Interventions Interventions: Breast feeding basics reviewed;Pre-pump if needed;Support pillows;Education;Hand pump;Position options  Discharge Discharge Education: Engorgement and breast care;Warning signs for feeding baby;Outpatient recommendation;Outpatient Epic message sent Pump: Personal;Manual [facility] Doy Mince)  Consult Status Consult Status: Complete    Margaret Roman 09/30/2021, 3:10 PM

## 2021-09-30 NOTE — Lactation Note (Addendum)
This note was copied from a baby's chart. Lactation Consultation Note  Patient Name: Girl Caressa Angelina Sheriff [id number] Date: 09/30/2021 Reason for consult: Follow-up assessment Age:35 years. Infant is at 7 % wt loss. Mother reports that the latch is shallow. Mother just fed infant for 10 mins. Assist mother with hand expression. Gave mother a hand pump and assist with pumping. Mother pumped 10-12 ml of ebm.  Assist with latching infant in cross cradle hold. Infant latched on and off for 10-15 mins. Multiple attempts to get good depth. Infant sleepy.  Infant switched to alternate breast. Observed feeding for 20 mins. Observed suck swallow pattern for several mins.   Infant has difficulty with curved tip feeding , infant spit small amt of ebm when attempted to feed. Infant fed with spoon and also spit some of the milk. Observed that infant does have a tight upper lip and a short tight posterior frenulum.   Discussed plan with mother.  Mother to breastfeed Pump and then supplement infant  Discussed using a wide base bottle nipple for feeding infant .  Advised to supplement infant after each breastfeeding.  Mother to follow up with LC OP. Message sent for appt. Discussed treatment and prevention of engorgement.    Maternal Data    Feeding Mother's Current Feeding Choice: Breast Milk  LATCH Score Latch: Repeated attempts needed to sustain latch, nipple held in mouth throughout feeding, stimulation needed to elicit sucking reflex.  Audible Swallowing: Spontaneous and intermittent  Type of Nipple: Everted at rest and after stimulation  Comfort (Breast/Nipple): Filling, red/small blisters or bruises, mild/mod discomfort  Hold (Positioning): Assistance needed to correctly position infant at breast and maintain latch.  LATCH Score: 7   Lactation Tools Discussed/Used    Interventions Interventions: Breast feeding basics reviewed;Hand express;Pre-pump if needed;Breast  compression;Adjust position;Support pillows;Position options;Hand pump;Ice;Education  Discharge Discharge Education: Engorgement and breast care;Warning signs for feeding baby;Outpatient recommendation;Outpatient Epic message sent Pump: Personal;Manual [facility] Doy Mince)  Consult Status Consult Status: Complete    Michel Bickers 09/30/2021, 11:59 AM

## 2021-10-03 LAB — SURGICAL PATHOLOGY

## 2021-10-10 ENCOUNTER — Telehealth (HOSPITAL_COMMUNITY): Payer: Self-pay | Admitting: *Deleted

## 2021-10-10 NOTE — Telephone Encounter (Signed)
Attempted hospital discharge follow-up call. Left message for patient to return RN call. Deforest Hoyles, RN, 10/10/21, [phone]

## 2021-11-15 DIAGNOSIS — E039 Hypothyroidism, unspecified: Secondary | ICD-10-CM | POA: Diagnosis not present

## 2021-11-15 DIAGNOSIS — D649 Anemia, unspecified: Secondary | ICD-10-CM | POA: Diagnosis not present

## 2021-12-22 ENCOUNTER — Ambulatory Visit
Admission: RE | Admit: 2021-12-22 | Discharge: 2021-12-22 | Disposition: A | Discharge status: Home / Self Care | Payer: BC Managed Care – PPO | Source: Ambulatory Visit | Attending: Internal Medicine | Admitting: Internal Medicine

## 2021-12-22 ENCOUNTER — Other Ambulatory Visit: Payer: Self-pay | Admitting: Internal Medicine

## 2021-12-22 DIAGNOSIS — E039 Hypothyroidism, unspecified: Secondary | ICD-10-CM | POA: Diagnosis not present

## 2021-12-22 DIAGNOSIS — M545 Low back pain, unspecified: Secondary | ICD-10-CM

## 2021-12-23 DIAGNOSIS — M545 Low back pain, unspecified: Secondary | ICD-10-CM | POA: Diagnosis not present

## 2021-12-27 DIAGNOSIS — E039 Hypothyroidism, unspecified: Secondary | ICD-10-CM | POA: Diagnosis not present

## 2021-12-27 DIAGNOSIS — D649 Anemia, unspecified: Secondary | ICD-10-CM | POA: Diagnosis not present

## 2022-01-06 DIAGNOSIS — M549 Dorsalgia, unspecified: Secondary | ICD-10-CM | POA: Diagnosis not present

## 2022-03-21 DIAGNOSIS — Z13220 Encounter for screening for lipoid disorders: Secondary | ICD-10-CM | POA: Diagnosis not present

## 2022-03-21 DIAGNOSIS — E039 Hypothyroidism, unspecified: Secondary | ICD-10-CM | POA: Diagnosis not present

## 2022-03-21 DIAGNOSIS — Z0000 Encounter for general adult medical examination without abnormal findings: Secondary | ICD-10-CM | POA: Diagnosis not present

## 2022-04-21 ENCOUNTER — Ambulatory Visit: Payer: BC Managed Care – PPO | Admitting: Family Medicine

## 2022-07-09 IMAGING — CR DG LUMBAR SPINE COMPLETE 4+V
5 series · 5 of 5 positions shown · non-contrast
Comparison: None.

CLINICAL DATA: Acute low back pain

EXAM:
LUMBAR SPINE - COMPLETE 4+ VIEW

[w l-spine a.p.]
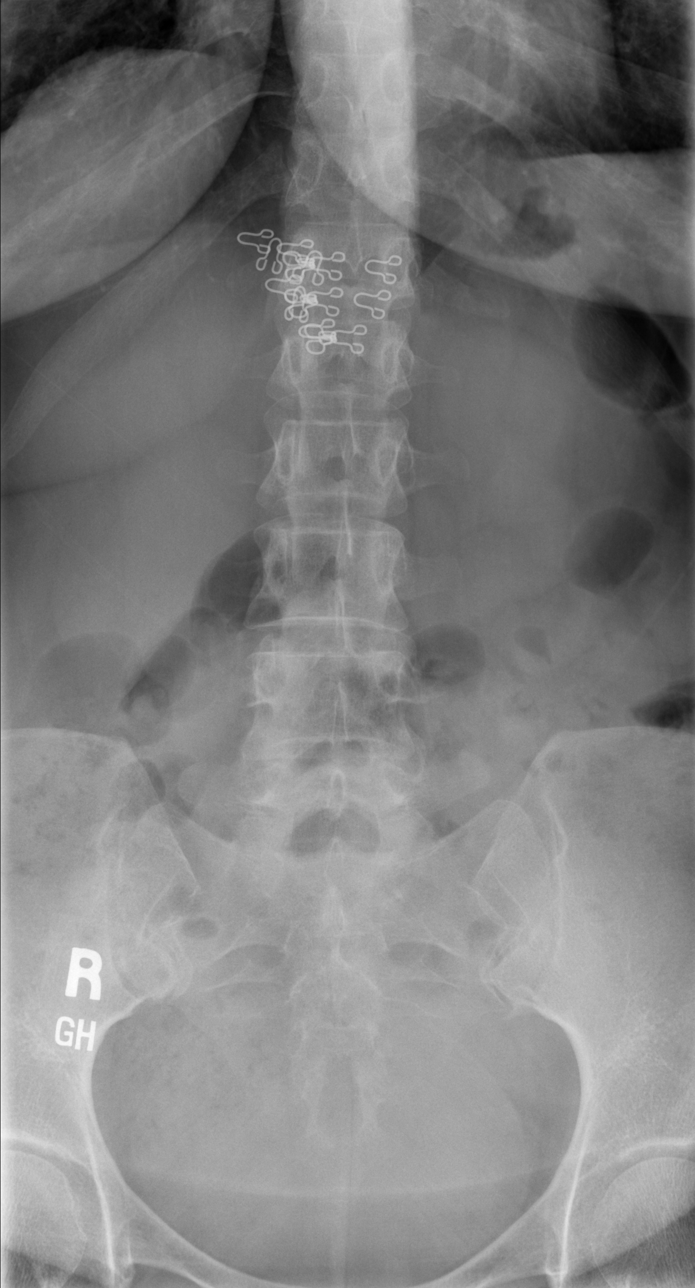

[w l-spine lat (1 of 3)]
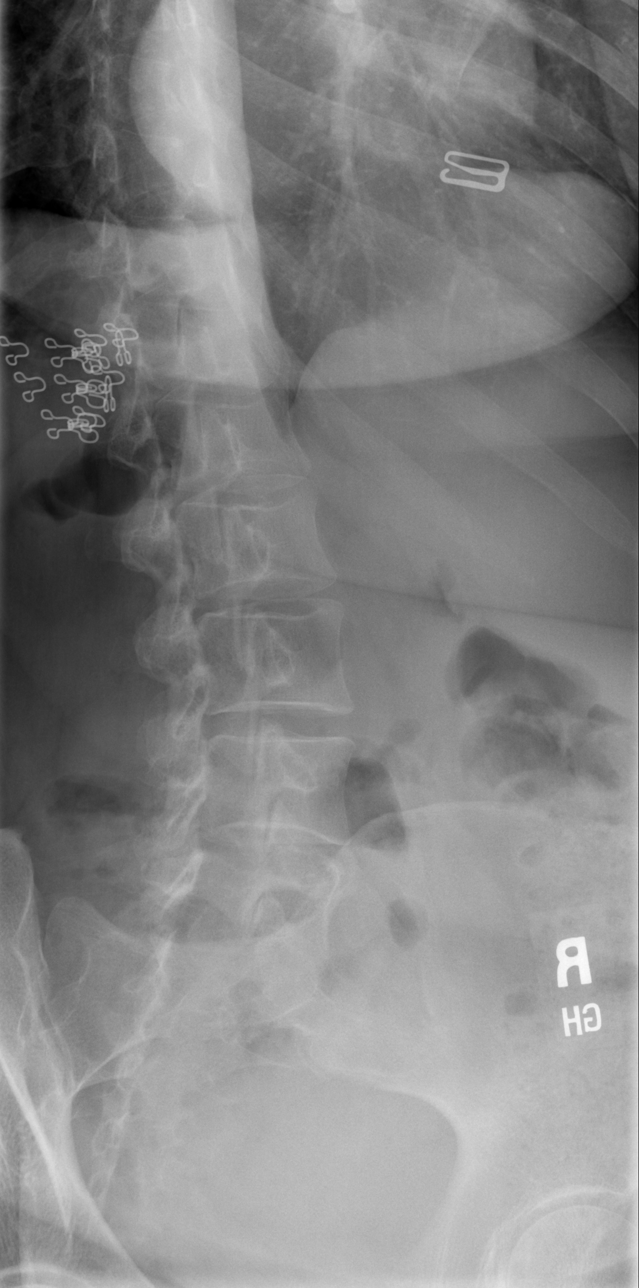

[w l-spine lat (2 of 3)]
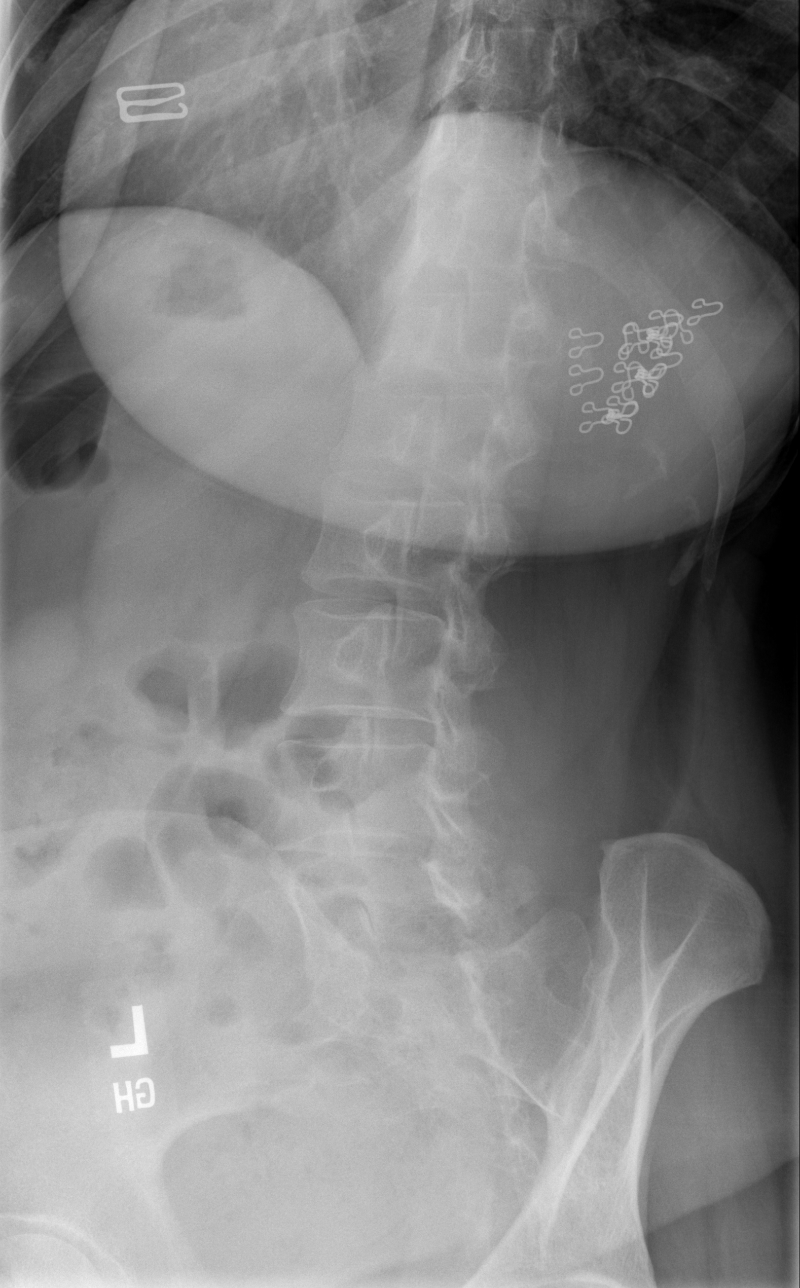

[w l-spine lat (3 of 3)]
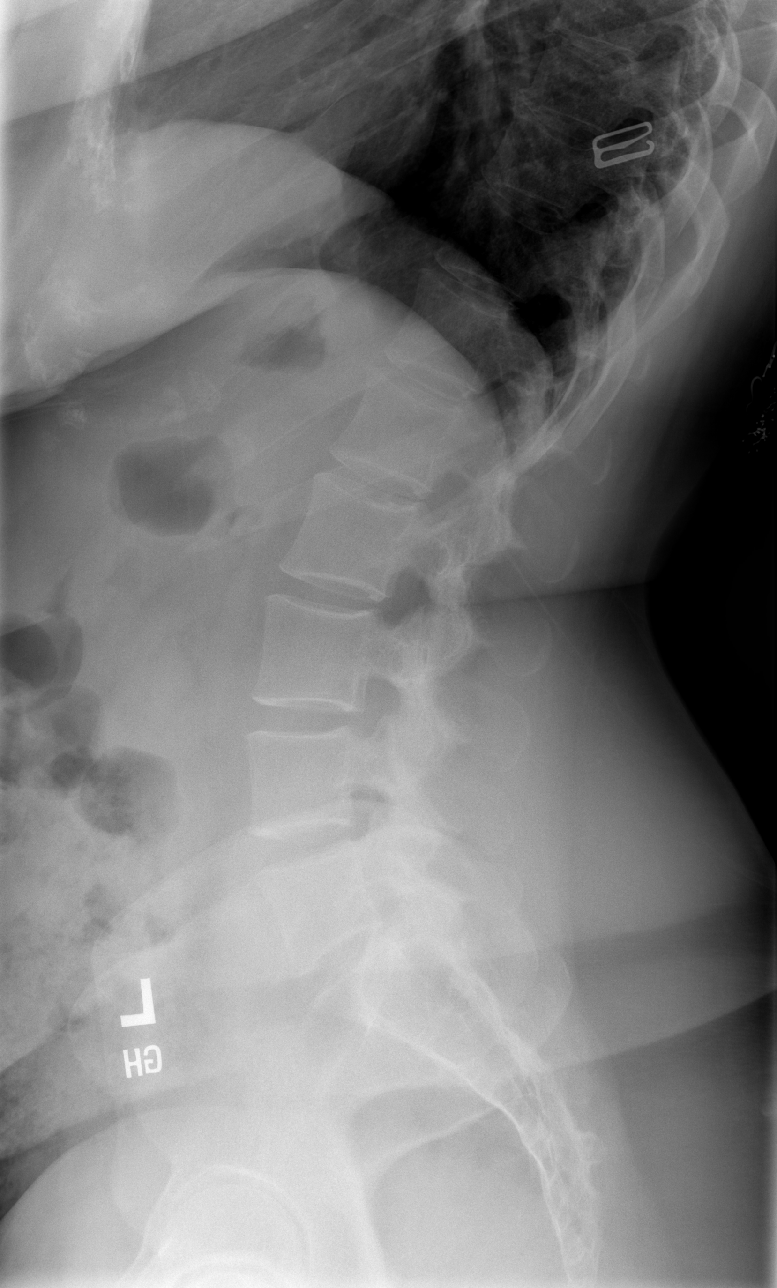

[w l-spine flexion/extension]
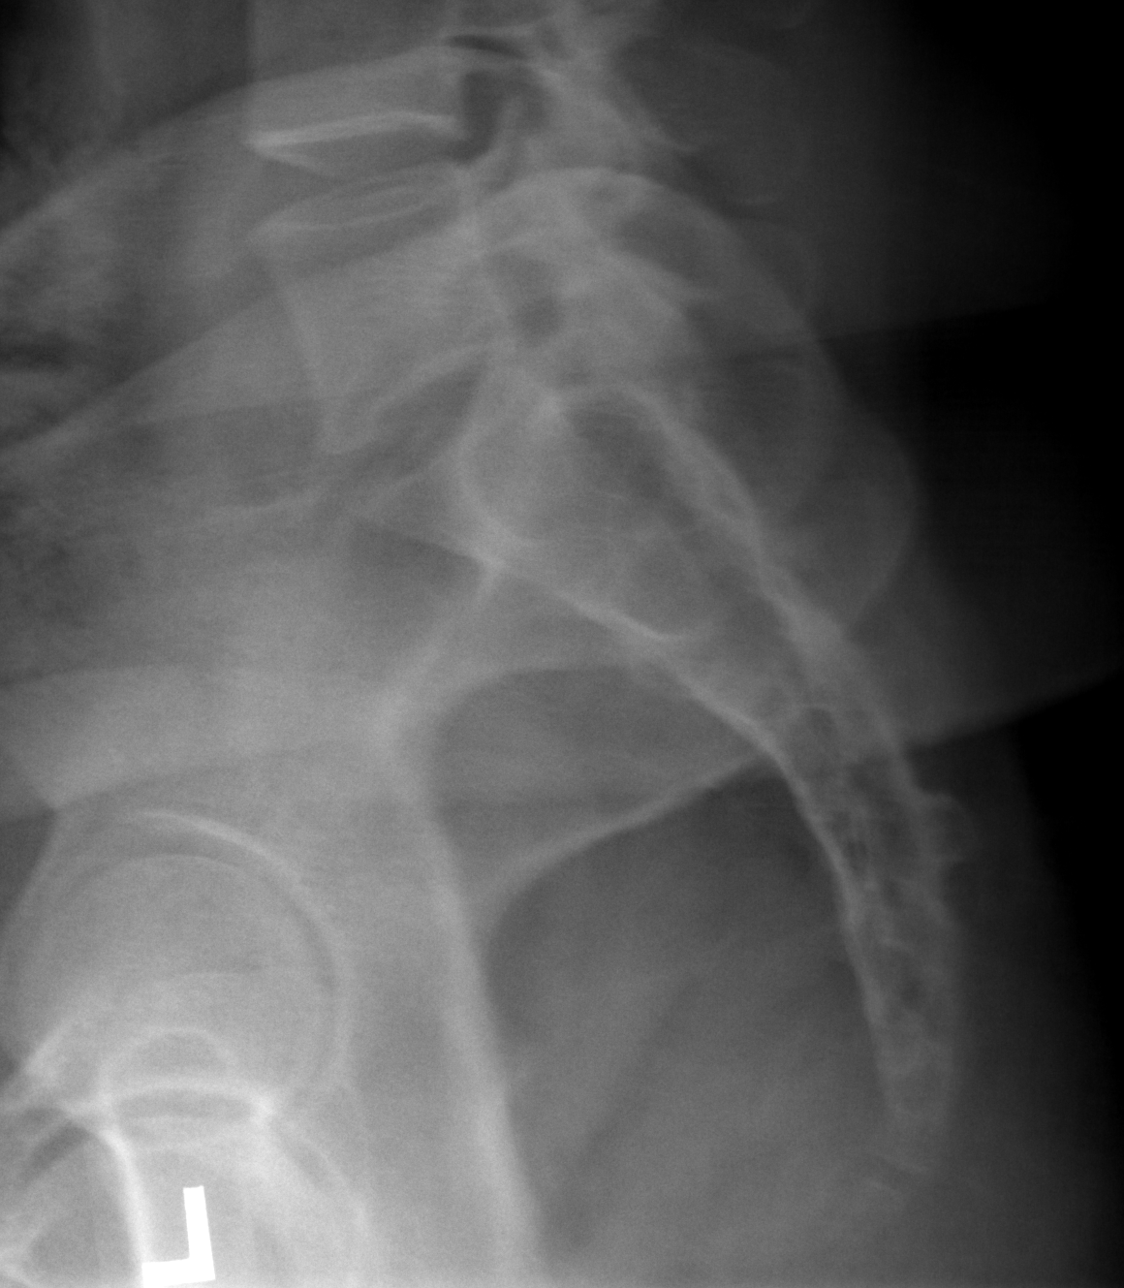

[5 of 5 positions shown; findings below may reference images not displayed]

FINDINGS: There is no evidence of lumbar spine fracture. Alignment is normal.
Intervertebral disc spaces are maintained. Likely mild facet
arthropathy in the lower lumbar spine.
IMPRESSION: No acute osseous abnormality identified.
